# Patient Record
Sex: Male | Born: 1990 | Race: White | Hispanic: No | Marital: Single | Smoking: Never smoker
Health system: Southern US, Community
[De-identification: ages and names within clinical notes are randomized; demographics above are authoritative.]

---

## 2016-10-25 ENCOUNTER — Ambulatory Visit (INDEPENDENT_AMBULATORY_CARE_PROVIDER_SITE_OTHER): Payer: 59 | Admitting: Family Medicine

## 2016-10-25 VITALS — BP 118/78 | HR 76 | Temp 98.3°F | Resp 17 | Ht 67.0 in | Wt 176.0 lb

## 2016-10-25 DIAGNOSIS — F410 Panic disorder [episodic paroxysmal anxiety] without agoraphobia: Secondary | ICD-10-CM | POA: Diagnosis not present

## 2016-10-25 DIAGNOSIS — K529 Noninfective gastroenteritis and colitis, unspecified: Secondary | ICD-10-CM

## 2016-10-25 DIAGNOSIS — T783XXA Angioneurotic edema, initial encounter: Secondary | ICD-10-CM | POA: Diagnosis not present

## 2016-10-25 LAB — POCT URINALYSIS DIP (MANUAL ENTRY)
BILIRUBIN UA: NEGATIVE
GLUCOSE UA: NEGATIVE
Leukocytes, UA: NEGATIVE
Nitrite, UA: NEGATIVE
RBC UA: NEGATIVE
SPEC GRAV UA: 1.015
UROBILINOGEN UA: 0.2
pH, UA: 6.5

## 2016-10-25 MED ORDER — ONDANSETRON 4 MG PO TBDP
4.0000 mg | ORAL_TABLET | Freq: Once | ORAL | Status: AC
  Administered 2016-10-25: 4 mg via ORAL

## 2016-10-25 MED ORDER — ONDANSETRON 4 MG PO TBDP: 4.0000 mg | ORAL_TABLET | Freq: Three times a day (TID) | ORAL | 0 refills | Status: DC | PRN

## 2016-10-25 NOTE — Progress Notes (Addendum)
By signing my name below, I, Harry Gonzalez, attest that this documentation has been prepared under the direction and in the presence of Harry SorensonEva Jahnya Trindade, MD.  Electronically Signed: Arvilla MarketMesha Gonzalez, Medical Scribe. 10/25/16. 1:46 PM.  Subjective:    Patient ID: Harry Gonzalez, male    DOB: 1991/11/06, 25 y.o.   MRN: [id number]  HPI Chief Complaint  Patient presents with  . Emesis    AFter eating undercooked fish monday night. Currently c/o nausea only. Pt needs work note.    HPI Comments: Harry Coveyatrick J Chappelle is a 25 y.o. male who presents to the [facility] complaining of emesis onset 2 days ago. Pt ate fish from [employer] around 9:34 pm. The next day he woke up a few time and had several emesis episodes with times the emesis came through his nose. Pt has only ate chicken noodle soup broth, dry cereal. He has a lack of appetite and some nausea today. Pt had a bowel movement the first time in 3 days. Denies fever, abdominal pain, diarrhea, melena, abnormal urine color, and difficulty urinating.  Anxiety/Depression: Pt mentions when he had severe depression when he was 25 y/o for 4 months he lost 15 lbs in 1 month, he couldn't go to the bathroom, and lateral severe facial swelling- during that time he wasn't on any medications. He has never been on medications for his depression and anxiety. He reports focal random swelling in his face. He also mentions random anxiety attacks that cause him to feel light-headed and dizzy occurring for the past 2 months at anytime- pt doesn't suspect any triggers. 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 He had a few more "bad episodes", but the last 2 months the episodes have been light. He notes intense left arm pain and there was an episode that was paired with chest tightness and 30 mins later he had cold sweats. He took ASA for relief, but found his face broken out with the random swelling. The past 3  weeks he's had the sharp arm pain located in the middle of his arm that radiate upwards with the only associated sx of occasional loss of sensation in his left pinky and ring finger. Since having his arm pain, he's had less anxiety attacks. He suspects it's either stress or anxiety that's triggering it since when he does relaxing task like playing with his family, or talking to his dad the arm pain goes away. Pt passed all his classes and he's studying abroad in [place] and mentions he's not stressed. He admitts to being scared of messing up his study abroad opportunity since he's wanted it for so long. He doesn't like to take drugs or anything that alters his mental state, so he doesn't drink or do drugs. He plans on having a complete physical next week to get blood work and a check up done. Denies palpitations, nausea and diaphoresis during the arm pain, trouble sleeping, tongue and lip swelling.  FHx: breast CA, skin CA, DM, and heart disease.  There are no active problems to display for this patient.  No past medical history on file. No past surgical history on file. Not on File Prior to Admission medications   Medication Sig Start Date End Date Taking? Authorizing Provider  aspirin 325 MG tablet Take 325 mg by mouth daily.   Yes Historical Provider, MD   Social History   Social History  . Marital status:  Single    Spouse name: N/A  . Number of children: N/A  . Years of education: N/A   Occupational History  . Not on file.   Social History Main Topics  . Smoking status: Never Smoker  . Smokeless tobacco: Not on file  . Alcohol use Not on file  . Drug use: Unknown  . Sexual activity: Not on file   Other Topics Concern  . Not on file   Social History Narrative  . No narrative on file   Review of Systems  Constitutional: Positive for diaphoresis. Negative for fever.  HENT: Positive for facial swelling.   Respiratory: Positive for chest tightness.   Cardiovascular: Positive for  chest pain. Negative for palpitations.  Gastrointestinal: Positive for constipation, nausea and vomiting. Negative for abdominal pain, blood in stool and diarrhea.  Genitourinary: Negative for difficulty urinating.  Musculoskeletal: Positive for myalgias.  Neurological: Positive for dizziness, syncope, light-headedness and numbness.  Psychiatric/Behavioral: Positive for dysphoric mood. Negative for sleep disturbance. The patient is nervous/anxious.    Objective:  Physical Exam  Constitutional: He appears well-developed and well-nourished. No distress.  HENT:  Head: Normocephalic and atraumatic.  Right Ear: Tympanic membrane normal.  Left Ear: Tympanic membrane normal.  Eyes: Conjunctivae are normal.  Neck: Neck supple.  Cardiovascular: Normal rate, regular rhythm and normal heart sounds.  Exam reveals no gallop and no friction rub.   No murmur heard. Pulmonary/Chest: Effort normal and breath sounds normal. No respiratory distress. He has no wheezes. He has no rales.  Abdominal: Soft. He exhibits no distension. There is no tenderness. There is no rebound and no guarding.  Neurological: He is alert.  Skin: Skin is warm and dry.  Psychiatric: He has a normal mood and affect. His behavior is normal.  Nursing note and vitals reviewed.  BP 118/78 (BP Location: Right Arm, Patient Position: Sitting, Cuff Size: Normal)   Pulse 76   Temp 98.3 F (36.8 C) (Oral)   Resp 17   Ht 5\' 7"  (1.702 m)   Wt 176 lb (79.8 kg)   SpO2 99%   BMI 27.57 kg/m    Results for orders placed or performed in visit on 10/25/16  POCT urinalysis dipstick  Result Value Ref Range   Color, UA yellow yellow   Clarity, UA clear clear   Glucose, UA negative negative   Bilirubin, UA negative negative   Ketones, POC UA trace (5) (A) negative   Spec Grav, UA 1.015    Blood, UA negative negative   pH, UA 6.5    Protein Ur, POC trace (A) negative   Urobilinogen, UA 0.2    Nitrite, UA Negative Negative   Leukocytes,  UA Negative Negative    Assessment & Plan:   1. Gastroenteritis, acute - suspect self-limited due to food. Supportive care.   2. Panic attack - Suspect many of his symptoms such as the left arm pain are being caused by anxiety. Rec establishing with student health psych   3. Angioedema, initial encounter - patient reports numerous prior episodes of isolated facial swelling of unknown etiology. possible that this is due to stress but recommend workup to rule out hereditary idiopathic angioedema. Refer to allergy. Tried Benadryl if recurs   Recommend routine screening labs with CBC CMP TSH as well as EKG but patient declines today. Will consider obtaining in the future  Orders Placed This Encounter  Procedures  . Ambulatory referral to Allergy    Referral Priority:   Routine  Referral Type:   Allergy Testing    Referral Reason:   Specialty Services Required    Requested Specialty:   Allergy    Number of Visits Requested:   1  . Care order/instruction:    AVS printed - let patient go!  Marland Kitchen. POCT urinalysis dipstick    Meds ordered this encounter  Medications  . aspirin 325 MG tablet    Sig: Take 325 mg by mouth daily.  . ondansetron (ZOFRAN-ODT) disintegrating tablet 4 mg  . ondansetron (ZOFRAN ODT) 4 MG disintegrating tablet    Sig: Take 1-2 tablets (4-8 mg total) by mouth every 8 (eight) hours as needed for nausea or vomiting.    Dispense:  20 tablet    Refill:  0    I personally performed the services described in this documentation, which was scribed in my presence. The recorded information has been reviewed and considered, and addended by me as needed.   Harry SorensonEva Maylon Sailors, M.D.  [facility]  [place] [address] [place], [place] [address] [phone] phone [phone]) [phone] fax  11/22/16 1:26 PM

## 2016-10-25 NOTE — Patient Instructions (Addendum)
I agree with you that it seems likely that anxiety is driving at least most if not all of your symptoms. Certainly in some rare cases it is possible for stress or anxiety to bring on the swelling in your face. However this is most likely to run in families when it happens. I recommend referral to allergist for further evaluation. I would definitely recommend further evaluation with orthostatic vital signs, EKG, blood count, complete metabolic panel, checking your complement levels, and thyroid test.  I think going in for evaluation by your student health psychologist is a fabulous idea. I would red recommend making this appointment ASAP. Certainly there are additional steps that we can be taken to find out the etiology of your left arm pain if it does not resolve when your panic attacks were treated.  Benadryl can be a useful medicine which can help even nausea, vomiting, anxiety, and sleep so if you have any symptoms persist at night, I would recommend trying first.    IF you received an x-ray today, you will receive an invoice from [facility]. Please contact [facility] at [phone] with questions or concerns regarding your invoice.   IF you received labwork today, you will receive an invoice from [facility]. Please contact [facility] at [phone] with questions or concerns regarding your invoice.   Our billing staff will not be able to assist you with questions regarding bills from these companies.  You will be contacted with the lab results as soon as they are available. The fastest way to get your results is to activate your My Chart account. Instructions are located on the last page of this paperwork. If you have not heard from us regarding the results in 2 weeks, please contact this office.      Food Poisoning Food poisoning is an illness that is caused by eating or drinking contaminated foods or drinks. In most cases, food poisoning is  mild and lasts 1-2 days. However, some cases can be serious, especially for people who have weak body defense (immune) systems, older people, children and infants, and pregnant women. What are the causes? Foods can become contaminated with viruses, bacteria, parasites, mold, or chemicals as a result of:  Poor personal hygiene, such as poor hand washing practices.  Storing food improperly, such as not refrigerating raw meat.  Using unclean surfaces for serving, preparing, and storing food.  Cooking or eating with unclean utensils. If contaminated food is eaten, viruses, bacteria, or parasites can harm the intestine. This often causes severe diarrhea. The most common causes of food poisoning include:  Viruses, such as:  Norovirus.  Rotavirus.  Bacteria, such as:  Salmonella.  Listeria.  E. coli (Escherichia coli).  Parasites, such as:  Giardia.  Toxoplasmosis. What are the signs or symptoms? Symptoms may take several hours to appear after you consume contaminated food or drink. Symptoms include:  Nausea.  Vomiting.  Cramping.  Diarrhea.  Fever and chills.  Muscle aches.  Dehydration. Dehydration can cause you to be tired and thirsty, have a dry mouth, and urinate less frequently. How is this diagnosed? Your health care provider can diagnose food poisoning with a medical history and physical exam. This will include asking you what you have recently eaten. You may also have tests, including:  Blood tests.  Stool tests. How is this treated? Treatment focuses on relieving your symptoms and making sure that you are hydrated. You may also be given medicines. In severe cases, hospitalization may be required  and you may need to receive fluids through an IV tube. Follow these instructions at home: Eating and drinking  Drink enough fluids to keep your urine clear or pale yellow. You may need to drink small amounts of clear liquids frequently.  Avoid milk, caffeine,  and alcohol.  Ask your health care provider for specific rehydration instructions.  Eat small, frequent meals rather than large meals. Medicines  Take over-the-counter and prescription medicines only as told by your health care provider. Ask your health care provider if you should continue to take any of your regular prescribed and over-the-counter medicines.  If you were prescribed an antibiotic medicine, take it as told by your health care provider. Do not stop taking the antibiotic even if you start to feel better. General instructions  Wash your hands thoroughly before you prepare food and after you go to the bathroom (use the toilet). Make sure people who live with you also wash their hands often.  Clean surfaces that you touch with a product that contains chlorine bleach.  Keep all follow-up visits as told by your health care provider. This is important. How is this prevented?  Wash your hands, food preparation surfaces, and utensils thoroughly before and after you handle raw foods.  Use separate food preparation surfaces and storage spaces for raw meat and for fruits and vegetables.  Keep refrigerated foods colder than 24F (5C).  Serve hot foods immediately or keep them heated above 124F (60C).  Store dry foods in cool, dry spaces away from excess heat or moisture. Throw out any foods that do not smell right or are in cans that are bulging.  Follow approved canning procedures.  Heat canned foods thoroughly before you taste them.  Drink bottled or sterile water when you travel. Get help right away if: Call 911 or go to the emergency room if:  You have difficulty breathing, swallowing, talking, or moving.  You develop blurred vision.  You cannot eat or drink without vomiting.  You faint.  Your eyes turn yellow.  Your vomiting or diarrhea is persistent.  Abdominal pain develops, increases, or localizes in one small area.  You have a fever.  You have blood  or mucus in your stools, or your stools look dark black and tarry.  You have signs of dehydration, such as:  Dark urine, very little urine, or no urine.  Cracked lips.  Not making tears while crying.  Dry mouth.  Sunken eyes.  Sleepiness.  Weakness.  Dizziness. This information is not intended to replace advice given to you by your health care provider. Make sure you discuss any questions you have with your health care provider. Document Released: 07/28/2004 Document Revised: 03/28/2016 Document Reviewed: 05/03/2015 Elsevier Interactive Patient Education  2017 Elsevier Inc.  Angioedema Angioedema is the sudden swelling of tissue in the body. Angioedema can affect any part of the body, but it most often affects the deeper parts of the skin, causing red, itchy patches (hives) to appear over the affected area. It often begins during the night and is found in the morning. Depending on the cause, angioedema may happen:  Only once.  Several times. It may come back in unpredictable patterns.  Repeatedly for several years. Over time, it may gradually stop coming back. Angioedema can be life-threatening if it affects the air passages that you breathe through. What are the causes? This condition may be caused by:  Foods, such as milk, eggs, shellfish, wheat, or nuts.  Certain medicines, such as ACE  inhibitors, antibiotics, nonsteroidal anti-inflammatory drugs, birth control pills, or dyes used in X-rays.  Insect stings.  Infections. Angioedema can be inherited, and episodes can be triggered by:  Mild injury.  Dental work.  Surgery.  Stress.  Sudden changes in temperature.  Exercise. In some cases, the cause of this condition is not known. What are the signs or symptoms? Symptoms of this condition depend on where the swelling happens. Symptoms may include:  Swollen skin.  Red, itchy patches of skin (hives).  Redness in the affected area.  Pain in the affected  area.  Swollen lips or tongue.  Wheezing.  Breathing problems. If your internal organs are involved, symptoms may also include:  Nausea.  Abdominal pain.  Vomiting.  Difficulty swallowing.  Difficulty passing urine. How is this diagnosed? This condition may be diagnosed based on:  An exam of the affected area.  Your medical history.  Whether anyone in your family has had this condition before.  A review of any medicines you have been taking.  Tests, including:  Allergy skin tests to see if the condition was caused by an allergic reaction.  Blood tests to see if the condition was caused by a gene.  Tests to check for underlying diseases that could cause the condition. How is this treated? Treatment for this condition depends on the cause. It may involve any of the following:  If something triggered the condition, making changes to keep it from triggering the condition again.  If the condition affects your breathing, having tubes placed in your airway to keep it open.  Taking medicines to treat symptoms or prevent future episodes. These may include:  Antihistamines.  Epinephrine injections.  Steroids. If your condition is severe, you may need to be treated at the hospital. Angioedema usually gets better in 24-48 hours. Follow these instructions at home:  Take over-the-counter and prescription medicines only as told by your health care provider.  If you were given medicines for emergency allergy treatment, always carry them with you.  Wear a medical bracelet as told by your health care provider.  If something triggers your condition, avoid the trigger, if possible.  If your condition is inherited and you are thinking about having children, talk to your health care provider. It is important to discuss the risks of passing on the condition to your children. Contact a health care provider if:  You have repeated episodes of angioedema.  Episodes of  angioedema start to happen more often than they used to, even after you take steps to prevent them.  You have episodes of angioedema that are more severe than they have been before, even after you take steps to prevent them.  You are thinking about having children. Get help right away if:  You have severe swelling of your mouth, tongue, or lips.  You have trouble breathing.  You have trouble swallowing.  You faint. This information is not intended to replace advice given to you by your health care provider. Make sure you discuss any questions you have with your health care provider. Document Released: 01/08/2002 Document Revised: 05/27/2016 Document Reviewed: 05/09/2016 Elsevier Interactive Patient Education  2017 [employer]

## 2017-07-19 ENCOUNTER — Ambulatory Visit (HOSPITAL_COMMUNITY)
Admission: EM | Admit: 2017-07-19 | Discharge: 2017-07-19 | Disposition: A | Discharge status: Home / Self Care | Payer: 59 | Attending: Internal Medicine | Admitting: Internal Medicine

## 2017-07-19 ENCOUNTER — Encounter (HOSPITAL_COMMUNITY): Payer: Self-pay | Admitting: Family Medicine

## 2017-07-19 DIAGNOSIS — H65199 Other acute nonsuppurative otitis media, unspecified ear: Secondary | ICD-10-CM

## 2017-07-19 DIAGNOSIS — H8303 Labyrinthitis, bilateral: Secondary | ICD-10-CM

## 2017-07-19 MED ORDER — MECLIZINE HCL 12.5 MG PO TABS: 12.5000 mg | ORAL_TABLET | Freq: Three times a day (TID) | ORAL | 1 refills | Status: DC | PRN

## 2017-07-19 NOTE — ED Provider Notes (Signed)
  [facility]   [id number] 07/19/17 Arrival Time: 1003   SUBJECTIVE:  Harry Gonzalez is a 26 y.o. male who presents to the urgent care with complaint of bilateral ear pain and pressure, ringing in the ears, and ear pain. He believes he may have injured his ear, or cost and impaction, because he used a commercial device he purchased to try to remove the earwax. Denies any cough, congestion, has not had any exposure to water or swimming, no nausea or vomiting, has had some dizziness that is worse with movement. Otherwise denies any other symptoms.     History reviewed. No pertinent past medical history. History reviewed. No pertinent family history. Social History   Social History  . Marital status: Single    Spouse name: N/A  . Number of children: N/A  . Years of education: N/A   Occupational History  . Not on file.   Social History Main Topics  . Smoking status: Never Smoker  . Smokeless tobacco: Not on file  . Alcohol use Not on file  . Drug use: Unknown  . Sexual activity: Not on file   Other Topics Concern  . Not on file   Social History Narrative  . No narrative on file   No outpatient prescriptions have been marked as taking for the 07/19/17 encounter [facility](Hospital Encounter).   No Known Allergies    ROS: As per HPI, remainder of ROS negative.   OBJECTIVE:  Vitals:   07/19/17 1027  BP: 115/81  Pulse: 83  Resp: 18  Temp: 98.1 F (36.7 C)  SpO2: 99%     Vitals:   07/19/17 1027  BP: 115/81  Pulse: 83  Resp: 18  Temp: 98.1 F (36.7 C)  SpO2: 99%    General appearance: alert; no distress Eyes: PERRLA; EOMI; conjunctiva normal HENT: normocephalic; atraumatic; TMs normal, canal normal, external ears normal without trauma; nasal mucosa normal; Septum midline, nares are patent, no swelling, erythema, or edema to the turbinates ;oral mucosa normal Neck: supple, no cervical lymphadenopathy Lungs: clear to auscultation bilaterally Heart: regular  rate and rhythm Abdomen: soft, non-tender; bowel sounds normal; no masses or organomegaly; no guarding or rebound tenderness Back: no CVA tenderness Extremities: no cyanosis or edema; symmetrical with no gross deformities Skin: warm and dry Neurologic: normal gait; Psychological: alert and cooperative; normal mood and affect      Labs: Labs Reviewed - No data to display  No results found.     ASSESSMENT & PLAN:  1. Acute otitis media with effusion   2. Labyrinthitis of both ears     Meds ordered this encounter  Medications  . meclizine (ANTIVERT) 12.5 MG tablet    Sig: Take 1 tablet (12.5 mg total) by mouth 3 (three) times daily as needed for dizziness.    Dispense:  30 tablet    Refill:  1    Order Specific Question:   Supervising Provider    Answer:   Eustace MooreMURRAY, LAURA W [[id number]]   OTC Flonase along with antihistamines Reviewed expectations re: course of current medical issues. Questions answered. Outlined signs and symptoms indicating need for more acute intervention. Patient verbalized understanding. After Visit Summary given.    Procedures:        [name], [name], NP 07/19/17 1100

## 2017-07-19 NOTE — ED Triage Notes (Signed)
Pt here for ringing and pain in right ear. sts that he has had fever around 100. sts that all of this started after trying to clean ears.

## 2017-07-19 NOTE — Discharge Instructions (Signed)
You have otitis media with effusion. This is not an infection, but rather a buildup of fluid within the inner ear. This is treated with inhaled nasal steroid such as Flonase, or budesonide, 2 sprays each nostril once a day. You can also take allergy medicine such as Claritin, Allegra, Zyrtec, Xyzal daily with this medicine as well. If your symptoms persist or fail to resolve, return to clinic as needed.  Your dizziness is most likely caused by labyrinthitis, which is an acute inflammation of the inner ear. For your dizziness at prescribed a medicine called meclizine, 1 tablet up to 3 times a day as needed for dizziness. This may cause some drowsiness.

## 2019-07-01 ENCOUNTER — Other Ambulatory Visit: Payer: Self-pay | Admitting: Physician Assistant

## 2019-07-01 DIAGNOSIS — R1011 Right upper quadrant pain: Secondary | ICD-10-CM | POA: Diagnosis not present

## 2019-07-07 ENCOUNTER — Other Ambulatory Visit: Payer: 59

## 2019-07-17 ENCOUNTER — Ambulatory Visit
Admission: RE | Admit: 2019-07-17 | Discharge: 2019-07-17 | Disposition: A | Discharge status: Home / Self Care | Payer: BC Managed Care – PPO | Source: Ambulatory Visit | Attending: Physician Assistant | Admitting: Physician Assistant

## 2019-07-17 DIAGNOSIS — R1011 Right upper quadrant pain: Secondary | ICD-10-CM

## 2019-07-28 ENCOUNTER — Encounter: Payer: Self-pay | Admitting: *Deleted

## 2019-09-21 DIAGNOSIS — Z20828 Contact with and (suspected) exposure to other viral communicable diseases: Secondary | ICD-10-CM | POA: Diagnosis not present

## 2019-09-26 DIAGNOSIS — R079 Chest pain, unspecified: Secondary | ICD-10-CM | POA: Diagnosis not present

## 2019-09-29 ENCOUNTER — Telehealth: Payer: Self-pay | Admitting: *Deleted

## 2019-09-29 NOTE — Telephone Encounter (Signed)
Unable to reach no voicemail 

## 2019-10-22 NOTE — Progress Notes (Signed)
Cardiology Office Note:    Date:  10/23/2019   ID:  Harry Gonzalez, DOB July 26, 1991, MRN [id number]  PCP:  Patient, No Pcp Per  Cardiologist:  No primary care provider on file.  Electrophysiologist:  None   Referring MD: Blenda Mounts, MD   Chief Complaint  Patient presents with  . Chest Pain    History of Present Illness:    Harry Gonzalez is a 28 y.o. male with a hx of who is referred by Dr. Reola Calkins for evaluation of chest pain.  Reports that when he is sitting for an extended period of time he starts to develop pain that begins in his lower back.  States that pain will radiate up his back and also across his chest.  Occurs intermittently, will not have any episodes for months but then can happen multiple times per day.  Has been occurring for past year.  Describes that when pain radiates across his chest that feels pressure in his chest and back.  Starts at 2-3 out of 10 intensity but will escalate to 8-9 out of 10.  States that he walks for about an hour each day and prior to Covid was exercising in the gym.  Denies any exertional chest pain.  No smoking history.  No family history of heart disease in his immediate family.   No past medical history on file.  No past surgical history on file.  Current Medications: No outpatient medications have been marked as taking for the 10/23/19 encounter (Office Visit) with Little Ishikawa, MD.     Allergies:   Patient has no known allergies.   Social History   Socioeconomic History  . Marital status: Single    Spouse name: Not on file  . Number of children: Not on file  . Years of education: Not on file  . Highest education level: Not on file  Occupational History  . Not on file  Tobacco Use  . Smoking status: Never Smoker  Substance and Sexual Activity  . Alcohol use: Not on file  . Drug use: Not on file  . Sexual activity: Not on file  Other Topics Concern  . Not on file  Social History Narrative  . Not on  file   Social Determinants of Health   Financial Resource Strain:   . Difficulty of Paying Living Expenses: Not on file  Food Insecurity:   . Worried About Programme researcher, broadcasting/film/video in the Last Year: Not on file  . Ran Out of Food in the Last Year: Not on file  Transportation Needs:   . Lack of Transportation (Medical): Not on file  . Lack of Transportation (Non-Medical): Not on file  Physical Activity:   . Days of Exercise per Week: Not on file  . Minutes of Exercise per Session: Not on file  Stress:   . Feeling of Stress : Not on file  Social Connections:   . Frequency of Communication with Friends and Family: Not on file  . Frequency of Social Gatherings with Friends and Family: Not on file  . Attends Religious Services: Not on file  . Active Member of Clubs or Organizations: Not on file  . Attends Banker Meetings: Not on file  . Marital Status: Not on file     Family History: The patient's family history includes Diabetes in his father.  ROS:   Please see the history of present illness.    All other systems reviewed and are negative.  EKGs/Labs/Other Studies Reviewed:    The following studies were reviewed today:  EKG:  EKG is ordered today.  The ekg ordered today demonstrates normal sinus rhythm, rate 80, no ST/T abnormalities  Recent Labs: No results found for requested labs within last 8760 hours.  Recent Lipid Panel No results found for: CHOL, TRIG, HDL, CHOLHDL, VLDL, LDLCALC, LDLDIRECT  Physical Exam:    VS:  BP 119/79   Pulse 80   Temp (!) 97.5 F (36.4 C)   Ht 5\' 7"  (1.702 m)   Wt 213 lb (96.6 kg)   BMI 33.36 kg/m     Wt Readings from Last 3 Encounters:  10/23/19 213 lb (96.6 kg)  10/25/16 176 lb (79.8 kg)     GEN: Well nourished, well developed in no acute distress HEENT: Normal NECK: No JVD; No carotid bruits LYMPHATICS: No lymphadenopathy CARDIAC: RRR, no murmurs, rubs, gallops RESPIRATORY:  Clear to auscultation without rales,  wheezing or rhonchi  ABDOMEN: Soft, non-tender, non-distended MUSCULOSKELETAL:  No edema; No deformity  SKIN: Warm and dry NEUROLOGIC:  Alert and oriented x 3 PSYCHIATRIC:  Normal affect   ASSESSMENT:    1. Chest pain of uncertain etiology    PLAN:    In order of problems listed above:  Chest pain: Description consistent with noncardiac chest pain, as describes nonexertional pain that starts in his lower back and radiates across his chest.  Given age and lack of risk factors, he would be at low risk of coronary artery disease.  Recommend monitoring for now.     Medication Adjustments/Labs and Tests Ordered: Current medicines are reviewed at length with the patient today.  Concerns regarding medicines are outlined above.  No orders of the defined types were placed in this encounter.  No orders of the defined types were placed in this encounter.   Patient Instructions  Medication Instructions:  NO CHANGES  Lab Work: NONE   Testing/Procedures: NONE   Follow-Up: At [facility], you and your health needs are our priority.  As part of our continuing mission to provide you with exceptional heart care, we have created designated Provider Care Teams.  These Care Teams include your primary Cardiologist (physician) and Advanced Practice Providers (APPs -  Physician Assistants and Nurse Practitioners) who all work together to provide you with the care you need, when you need it.  Your next appointment:   3 month(s)  The format for your next appointment:   Either In Person or Virtual  Provider:   You may see DR [facility] or one of the following Advanced Practice Providers on your designated Care Team:    Rosaria Ferries, PA-C  Jory Sims, DNP, ANP  Cadence Kathlen Mody, NP       Signed, Donato Heinz, MD  10/23/2019 11:33 AM    [place]

## 2019-10-23 ENCOUNTER — Other Ambulatory Visit: Payer: Self-pay

## 2019-10-23 ENCOUNTER — Encounter: Payer: Self-pay | Admitting: Cardiology

## 2019-10-23 ENCOUNTER — Ambulatory Visit (INDEPENDENT_AMBULATORY_CARE_PROVIDER_SITE_OTHER): Payer: BC Managed Care – PPO | Admitting: Cardiology

## 2019-10-23 VITALS — BP 119/79 | HR 80 | Temp 97.5°F | Ht 67.0 in | Wt 213.0 lb

## 2019-10-23 DIAGNOSIS — R079 Chest pain, unspecified: Secondary | ICD-10-CM | POA: Diagnosis not present

## 2019-10-23 NOTE — Patient Instructions (Signed)
Medication Instructions:  NO CHANGES  Lab Work: NONE   Testing/Procedures: NONE   Follow-Up: At [facility], you and your health needs are our priority.  As part of our continuing mission to provide you with exceptional heart care, we have created designated Provider Care Teams.  These Care Teams include your primary Cardiologist (physician) and Advanced Practice Providers (APPs -  Physician Assistants and Nurse Practitioners) who all work together to provide you with the care you need, when you need it.  Your next appointment:   3 month(s)  The format for your next appointment:   Either In Person or Virtual  Provider:   You may see DR [facility] or one of the following Advanced Practice Providers on your designated Care Team:    Rosaria Ferries, PA-C  Jory Sims, DNP, ANP  Cadence Kathlen Mody, NP

## 2019-11-05 ENCOUNTER — Ambulatory Visit: Payer: BC Managed Care – PPO | Admitting: Neurology

## 2019-11-05 ENCOUNTER — Other Ambulatory Visit: Payer: Self-pay

## 2019-11-05 ENCOUNTER — Encounter: Payer: Self-pay | Admitting: Neurology

## 2019-11-05 DIAGNOSIS — R253 Fasciculation: Secondary | ICD-10-CM

## 2019-11-05 NOTE — Progress Notes (Signed)
Reason for visit: Muscle twitches, back pain, migraine headache  Referring physician: Dr. Luna Kitchens is a 28 y.o. male  History of present illness:  Mr. Hanak is a 28 year old right-handed white male with a history of muscle twitches that began approximately 2 months prior to this evaluation.  The patient will have migratory muscle twitches that occur in the thigh or arm or stomach or back.  He also has twitches that affect the upper eyelid off and on.  The patient may have 3-4 such episodes a week.  The episodes are not painful, he does not have muscle cramps.  He does not have any weakness of the extremities or numbness.  At times he may have some slight numbness in the thumbs and wrist pain, he works on a computer throughout the day.  The patient has very little exercise, he has gained 60 pounds in the last year to year and a half.  He reports some episodes of neuromuscular pain that began in the back and spread up the back or across from left to right or right to left while sitting.  If he gets up and stands and stretches this helps the pain.  He does have irritable bowel syndrome and he also has some problems with underlying anxiety issues.  He drinks 1 caffeinated suffering daily, he cannot drink coffee secondary to anxiety.  He has had a recent cardiology evaluation.  The patient does have episodes of increased heart rate at times.  The muscle twitches at times keep him awake at night.  The patient denies any balance problems or difficulty controlling the bladder.  He is sent to this office for an evaluation.  He reports history of migraine headaches occurring 2 or 3 times a week, sometimes associated with visual complaints.  History reviewed. No pertinent past medical history.  History reviewed. No pertinent surgical history.  Family History  Problem Relation Age of Onset  . Diabetes Father     Social history:  reports that he has never smoked. He has never used  smokeless tobacco. He reports previous alcohol use. No history on file for drug.  Medications:  Prior to Admission medications   Not on File     No Known Allergies  ROS:  Out of a complete 14 system review of symptoms, the patient complains only of the following symptoms, and all other reviewed systems are negative.  Muscle twitches Headache Irritable bowel  Blood pressure 121/84, pulse 80, temperature 97.7 F (36.5 C), height 5\' 6"  (1.676 m), weight 212 lb 8 oz (96.4 kg).  Physical Exam  General: The patient is alert and cooperative at the time of the examination.  The patient is moderately obese.  Eyes: Pupils are equal, round, and reactive to light. Discs are flat bilaterally.  Neck: The neck is supple, no carotid bruits are noted.  Respiratory: The respiratory examination is clear.  Cardiovascular: The cardiovascular examination reveals a regular rate and rhythm, no obvious murmurs or rubs are noted.  Skin: Extremities are without significant edema.  Neurologic Exam  Mental status: The patient is alert and oriented x 3 at the time of the examination. The patient has apparent normal recent and remote memory, with an apparently normal attention span and concentration ability.  Cranial nerves: Facial symmetry is present. There is good sensation of the face to pinprick and soft touch bilaterally. The strength of the facial muscles and the muscles to head turning and shoulder shrug are normal bilaterally.  Speech is well enunciated, no aphasia or dysarthria is noted. Extraocular movements are full. Visual fields are full. The tongue is midline, and the patient has symmetric elevation of the soft palate. No obvious hearing deficits are noted.  Motor: The motor testing reveals 5 over 5 strength of all 4 extremities. Good symmetric motor tone is noted throughout.  Sensory: Sensory testing is intact to pinprick, soft touch, vibration sensation, and position sense on all 4  extremities. No evidence of extinction is noted.  Coordination: Cerebellar testing reveals good finger-nose-finger and heel-to-shin bilaterally.  Tinel's sign at the wrists are negative bilaterally.  Gait and station: Gait is normal. Tandem gait is normal. Romberg is negative. No drift is seen.  Reflexes: Deep tendon reflexes are symmetric and normal bilaterally. Toes are downgoing bilaterally.   Assessment/Plan:  1.  Benign muscle twitches  2.  Migraine headache  3.  Intermittent neuromuscular discomfort, low back  I have recommended he get into a weight loss program and a regular exercise program.  This may help the back issues and reduce some of his underlying anxiety.  The muscle twitches are likely to be benign, he has no weakness on clinical examination and no evidence of atrophy.  No further neurologic work-up in this regard is indicated.  If the patient desires to have medication for his migraine, would consider starting propranolol which may also reduce anxiety.  At this point, he will follow-up through this office if needed.  Marlan Palau MD 11/05/2019 7:30 PM  [facility] [address] [place], [place] [address]  Phone [phone] Fax [phone]

## 2019-12-24 DIAGNOSIS — R519 Headache, unspecified: Secondary | ICD-10-CM | POA: Diagnosis not present

## 2019-12-24 DIAGNOSIS — R509 Fever, unspecified: Secondary | ICD-10-CM | POA: Diagnosis not present

## 2020-01-21 ENCOUNTER — Ambulatory Visit: Payer: BC Managed Care – PPO | Admitting: Cardiology

## 2020-02-25 NOTE — Progress Notes (Deleted)
Cardiology Office Note:    Date:  02/25/2020   ID:  Harry Gonzalez, DOB Oct 11, 1991, MRN [id number]  PCP:  Patient, No Pcp Per  Cardiologist:  No primary care provider on file.  Electrophysiologist:  None   Referring MD: No ref. provider found   No chief complaint on file.   History of Present Illness:    Harry Gonzalez is a 29 y.o. male with no significant past medical history who presents for follow-up. He was referred by Dr. Olevia Bowens for evaluation of chest pain, initially seen on 10/23/2019. Reports that when he is sitting for an extended period of time he starts to develop pain that begins in his lower back.  States that pain will radiate up his back and also across his chest.  Occurs intermittently, will not have any episodes for months but then can happen multiple times per day.  Has been occurring for past year.  Describes that when pain radiates across his chest that feels pressure in his chest and back.  Starts at 2-3 out of 10 intensity but will escalate to 8-9 out of 10.  States that he walks for about an hour each day and prior to Covid was exercising in the gym.  Denies any exertional chest pain.  No smoking history.  No family history of heart disease in his immediate family.  At initial clinic visit on 10/23/2019, felt to likely represent noncardiac chest pain. Recommended monitoring.  No past medical history on file.  No past surgical history on file.  Current Medications: No outpatient medications have been marked as taking for the 02/26/20 encounter (Appointment) with Donato Heinz, MD.     Allergies:   Patient has no known allergies.   Social History   Socioeconomic History  . Marital status: Single    Spouse name: Not on file  . Number of children: Not on file  . Years of education: Not on file  . Highest education level: Not on file  Occupational History  . Not on file  Tobacco Use  . Smoking status: Never Smoker  . Smokeless tobacco: Never Used   Substance and Sexual Activity  . Alcohol use: Not Currently  . Drug use: Not on file  . Sexual activity: Not on file  Other Topics Concern  . Not on file  Social History Narrative  . Not on file   Social Determinants of Health   Financial Resource Strain:   . Difficulty of Paying Living Expenses:   Food Insecurity:   . Worried About Charity fundraiser in the Last Year:   . Arboriculturist in the Last Year:   Transportation Needs:   . Film/video editor (Medical):   Marland Kitchen Lack of Transportation (Non-Medical):   Physical Activity:   . Days of Exercise per Week:   . Minutes of Exercise per Session:   Stress:   . Feeling of Stress :   Social Connections:   . Frequency of Communication with Friends and Family:   . Frequency of Social Gatherings with Friends and Family:   . Attends Religious Services:   . Active Member of Clubs or Organizations:   . Attends Archivist Meetings:   Marland Kitchen Marital Status:      Family History: The patient's family history includes Diabetes in his father.  ROS:   Please see the history of present illness.    All other systems reviewed and are negative.  EKGs/Labs/Other Studies Reviewed:  The following studies were reviewed today:  EKG:  EKG is ordered today.  The ekg ordered today demonstrates normal sinus rhythm, rate 80, no ST/T abnormalities  Recent Labs: No results found for requested labs within last 8760 hours.  Recent Lipid Panel No results found for: CHOL, TRIG, HDL, CHOLHDL, VLDL, LDLCALC, LDLDIRECT  Physical Exam:    VS:  There were no vitals taken for this visit.    Wt Readings from Last 3 Encounters:  11/05/19 212 lb 8 oz (96.4 kg)  10/23/19 213 lb (96.6 kg)  10/25/16 176 lb (79.8 kg)     GEN: Well nourished, well developed in no acute distress HEENT: Normal NECK: No JVD; No carotid bruits LYMPHATICS: No lymphadenopathy CARDIAC: RRR, no murmurs, rubs, gallops RESPIRATORY:  Clear to auscultation without  rales, wheezing or rhonchi  ABDOMEN: Soft, non-tender, non-distended MUSCULOSKELETAL:  No edema; No deformity  SKIN: Warm and dry NEUROLOGIC:  Alert and oriented x 3 PSYCHIATRIC:  Normal affect   ASSESSMENT:    No diagnosis found. PLAN:    In order of problems listed above:  Chest pain: Description consistent with noncardiac chest pain, as describes nonexertional pain that starts in his lower back and radiates across his chest.  Given age and lack of risk factors, he would be at low risk of coronary artery disease.  Recommend monitoring for now.     Medication Adjustments/Labs and Tests Ordered: Current medicines are reviewed at length with the patient today.  Concerns regarding medicines are outlined above.  No orders of the defined types were placed in this encounter.  No orders of the defined types were placed in this encounter.   There are no Patient Instructions on file for this visit.   Signed, Little Ishikawa, MD  02/25/2020 9:06 PM    [facility]

## 2020-02-26 ENCOUNTER — Ambulatory Visit: Payer: BC Managed Care – PPO | Admitting: Cardiology

## 2020-03-21 NOTE — Progress Notes (Signed)
Cardiology Office Note:    Date:  03/24/2020   ID:  Harry Gonzalez, DOB 05/19/1991, MRN [id number]  PCP:  Patient, No Pcp Per  Cardiologist:  No primary care provider on file.  Electrophysiologist:  None   Referring MD: No ref. provider found   Chief Complaint  Patient presents with  . Chest Pain    History of Present Illness:    Harry Gonzalez is a 29 y.o. male with no significant past medical history who presents for follow-up. He was referred by Dr. Reola Calkins for evaluation of chest pain, initially seen on 10/23/2019. Reports that when he is sitting for an extended period of time he starts to develop pain that begins in his lower back.  States that pain will radiate up his back and also across his chest.  Occurs intermittently, will not have any episodes for months but then can happen multiple times per day.  Has been occurring for past year.  Describes that when pain radiates across his chest that feels pressure in his chest and back.  Starts at 2-3 out of 10 intensity but will escalate to 8-9 out of 10.  States that he walks for about an hour each day and prior to Covid was exercising in the gym.  Denies any exertional chest pain.  No smoking history.  No family history of heart disease in his immediate family.  Significant history in extended family, as reports his paternal grandfather died of a heart attack in his 67s and great uncles on his father's side also had heart attacks in 73s (though reports issues with drug abuse).  At initial clinic visit on 10/23/2019, felt to likely represent noncardiac chest pain. Recommended monitoring.  Since his initial visit, he reports that he has continued to have intermittent chest pain.  Occurs about once every 1 to 2 weeks.  He walks about 4 miles per day, denies any exertional pain.  Also reports that he has been having palpitations, that he describes as feeling like his heart is racing.  Has only occurred at night.  Happens about once every 2 weeks.   He denies any lightheadedness or syncope.    No past medical history on file.  No past surgical history on file.  Current Medications: No outpatient medications have been marked as taking for the 03/24/20 encounter (Office Visit) with Little Ishikawa, MD.     Allergies:   Patient has no known allergies.   Social History   Socioeconomic History  . Marital status: Single    Spouse name: Not on file  . Number of children: Not on file  . Years of education: Not on file  . Highest education level: Not on file  Occupational History  . Not on file  Tobacco Use  . Smoking status: Never Smoker  . Smokeless tobacco: Never Used  Substance and Sexual Activity  . Alcohol use: Not Currently  . Drug use: Not on file  . Sexual activity: Not on file  Other Topics Concern  . Not on file  Social History Narrative  . Not on file   Social Determinants of Health   Financial Resource Strain:   . Difficulty of Paying Living Expenses:   Food Insecurity:   . Worried About Programme researcher, broadcasting/film/video in the Last Year:   . Barista in the Last Year:   Transportation Needs:   . Freight forwarder (Medical):   Marland Kitchen Lack of Transportation (Non-Medical):   Physical Activity:   .  Days of Exercise per Week:   . Minutes of Exercise per Session:   Stress:   . Feeling of Stress :   Social Connections:   . Frequency of Communication with Friends and Family:   . Frequency of Social Gatherings with Friends and Family:   . Attends Religious Services:   . Active Member of Clubs or Organizations:   . Attends Archivist Meetings:   Marland Kitchen Marital Status:      Family History: The patient's family history includes Diabetes in his father.  ROS:   Please see the history of present illness.    All other systems reviewed and are negative.  EKGs/Labs/Other Studies Reviewed:    The following studies were reviewed today:  EKG:  EKG is ordered today.  The ekg ordered today demonstrates  normal sinus rhythm, rate 80, no ST/T abnormalities  Recent Labs: No results found for requested labs within last 8760 hours.  Recent Lipid Panel No results found for: CHOL, TRIG, HDL, CHOLHDL, VLDL, LDLCALC, LDLDIRECT  Physical Exam:    VS:  BP 120/87 (BP Location: Left Arm, Patient Position: Sitting, Cuff Size: Large)   Pulse 87   Ht 5\' 7"  (1.702 m)   Wt 218 lb (98.9 kg)   SpO2 99%   BMI 34.14 kg/m     Wt Readings from Last 3 Encounters:  03/24/20 218 lb (98.9 kg)  11/05/19 212 lb 8 oz (96.4 kg)  10/23/19 213 lb (96.6 kg)     GEN: Well nourished, well developed in no acute distress HEENT: Normal NECK: No JVD; No carotid bruits LYMPHATICS: No lymphadenopathy CARDIAC: RRR, no murmurs, rubs, gallops RESPIRATORY:  Clear to auscultation without rales, wheezing or rhonchi  ABDOMEN: Soft, non-tender, non-distended MUSCULOSKELETAL:  No edema; No deformity  SKIN: Warm and dry NEUROLOGIC:  Alert and oriented x 3 PSYCHIATRIC:  Normal affect   ASSESSMENT:    1. Palpitations   2. Chest pain of uncertain etiology    PLAN:     Palpitations: Description concerning for arrhythmia, will check Zio patch x2 weeks  Chest pain: Description consistent with noncardiac chest pain, as describes nonexertional pain that starts in his lower back and radiates across his chest.  Given age and lack of risk factors, he would be at low risk of coronary artery disease.  Recommend monitoring for now.    RTC in 3 months   Medication Adjustments/Labs and Tests Ordered: Current medicines are reviewed at length with the patient today.  Concerns regarding medicines are outlined above.  Orders Placed This Encounter  Procedures  . LONG TERM MONITOR (3-14 DAYS)   No orders of the defined types were placed in this encounter.   Patient Instructions  Medication Instructions:  Your physician recommends that you continue on your current medications as directed. Please refer to the Current Medication  list given to you today.  Testing/Procedures:  Bryn Gulling- Long Term Monitor Instructions   Your physician has requested you wear your ZIO patch monitor 14 days.   This is a single patch monitor.  [employer] supplies one patch monitor per enrollment.  Additional stickers are not available.   Please do not apply patch if you will be having a Nuclear Stress Test, Echocardiogram, Cardiac CT, MRI, or Chest Xray during the time frame you would be wearing the monitor. The patch cannot be worn during these tests.  You cannot remove and re-apply the ZIO XT patch monitor.   Your ZIO patch monitor will be sent USPS Priority  mail from [employer] directly to your home address. The monitor may also be mailed to a PO BOX if home delivery is not available.   It may take 3-5 days to receive your monitor after you have been enrolled.   Once you have received you monitor, please review enclosed instructions.  Your monitor has already been registered assigning a specific monitor serial # to you.   Applying the monitor   Shave hair from upper left chest.   Hold abrader disc by orange tab.  Rub abrader in 40 strokes over left upper chest as indicated in your monitor instructions.   Clean area with 4 enclosed alcohol pads .  Use all pads to assure are is cleaned thoroughly.  Let dry.   Apply patch as indicated in monitor instructions.  Patch will be place under collarbone on left side of chest with arrow pointing upward.   Rub patch adhesive wings for 2 minutes.Remove white label marked "1".  Remove white label marked "2".  Rub patch adhesive wings for 2 additional minutes.   While looking in a mirror, press and release button in center of patch.  A small green light will flash 3-4 times .  This will be your only indicator the monitor has been turned on.     Do not shower for the first 24 hours.  You may shower after the first 24 hours.   Press button if you feel a symptom. You will hear a small click.   Record Date, Time and Symptom in the Patient Log Book.   When you are ready to remove patch, follow instructions on last 2 pages of Patient Log Book.  Stick patch monitor onto last page of Patient Log Book.   Place Patient Log Book in [place] box.  Use locking tab on box and tape box closed securely.  The [employer] and [employer] has [employer] on it.  Please place in mailbox as soon as possible.  Your physician should have your test results approximately 7 days after the monitor has been mailed back to [facility].   Call [facility] Customer Care at [phone] if you have questions regarding your ZIO XT patch monitor.  Call them immediately if you see an orange light blinking on your monitor.   If your monitor falls off in less than 4 days contact our Monitor department at [phone].  If your monitor becomes loose or falls off after 4 days call [employer] at [phone] for suggestions on securing your monitor.     Follow-Up: At [facility], you and your health needs are our priority.  As part of our continuing mission to provide you with exceptional heart care, we have created designated Provider Care Teams.  These Care Teams include your primary Cardiologist (physician) and Advanced Practice Providers (APPs -  Physician Assistants and Nurse Practitioners) who all work together to provide you with the care you need, when you need it.  We recommend signing up for the patient portal called "MyChart".  Sign up information is provided on this After Visit Summary.  MyChart is used to connect with patients for Virtual Visits (Telemedicine).  Patients are able to view lab/test results, encounter notes, upcoming appointments, etc.  Non-urgent messages can be sent to your provider as well.   To learn more about what you can do with MyChart, go to [email or web address].    Your next appointment:   3 month(s)  The format for your next appointment:   In Person or Virtual  Provider:    Epifanio Lesches, MD       Signed, Little Ishikawa, MD  03/24/2020 6:38 PM    [facility]

## 2020-03-24 ENCOUNTER — Other Ambulatory Visit: Payer: Self-pay

## 2020-03-24 ENCOUNTER — Encounter: Payer: Self-pay | Admitting: *Deleted

## 2020-03-24 ENCOUNTER — Ambulatory Visit (INDEPENDENT_AMBULATORY_CARE_PROVIDER_SITE_OTHER): Payer: BC Managed Care – PPO | Admitting: Cardiology

## 2020-03-24 ENCOUNTER — Encounter: Payer: Self-pay | Admitting: Cardiology

## 2020-03-24 VITALS — BP 120/87 | HR 87 | Ht 67.0 in | Wt 218.0 lb

## 2020-03-24 DIAGNOSIS — R002 Palpitations: Secondary | ICD-10-CM | POA: Diagnosis not present

## 2020-03-24 DIAGNOSIS — R079 Chest pain, unspecified: Secondary | ICD-10-CM

## 2020-03-24 NOTE — Progress Notes (Signed)
Patient ID: Harry Gonzalez, male   DOB: December 08, 1990, 29 y.o.   MRN: [id number] ZIO XT sent to patients home.

## 2020-03-24 NOTE — Patient Instructions (Signed)
Medication Instructions:  Your physician recommends that you continue on your current medications as directed. Please refer to the Current Medication list given to you today.  Testing/Procedures:  Christena Deem- Long Term Monitor Instructions   Your physician has requested you wear your ZIO patch monitor 14 days.   This is a single patch monitor.  [employer] supplies one patch monitor per enrollment.  Additional stickers are not available.   Please do not apply patch if you will be having a Nuclear Stress Test, Echocardiogram, Cardiac CT, MRI, or Chest Xray during the time frame you would be wearing the monitor. The patch cannot be worn during these tests.  You cannot remove and re-apply the ZIO XT patch monitor.   Your ZIO patch monitor will be sent USPS Priority mail from [facility] directly to your home address. The monitor may also be mailed to a PO BOX if home delivery is not available.   It may take 3-5 days to receive your monitor after you have been enrolled.   Once you have received you monitor, please review enclosed instructions.  Your monitor has already been registered assigning a specific monitor serial # to you.   Applying the monitor   Shave hair from upper left chest.   Hold abrader disc by orange tab.  Rub abrader in 40 strokes over left upper chest as indicated in your monitor instructions.   Clean area with 4 enclosed alcohol pads .  Use all pads to assure are is cleaned thoroughly.  Let dry.   Apply patch as indicated in monitor instructions.  Patch will be place under collarbone on left side of chest with arrow pointing upward.   Rub patch adhesive wings for 2 minutes.Remove white label marked "1".  Remove white label marked "2".  Rub patch adhesive wings for 2 additional minutes.   While looking in a mirror, press and release button in center of patch.  A small green light will flash 3-4 times .  This will be your only indicator the monitor has been turned on.      Do not shower for the first 24 hours.  You may shower after the first 24 hours.   Press button if you feel a symptom. You will hear a small click.  Record Date, Time and Symptom in the Patient Log Book.   When you are ready to remove patch, follow instructions on last 2 pages of Patient Log Book.  Stick patch monitor onto last page of Patient Log Book.   Place Patient Log Book in [place] box.  Use locking tab on box and tape box closed securely.  The [employer] has [employer] on it.  Please place in mailbox as soon as possible.  Your physician should have your test results approximately 7 days after the monitor has been mailed back to [facility].   Call [facility] Customer Care at [phone] if you have questions regarding your ZIO XT patch monitor.  Call them immediately if you see an orange light blinking on your monitor.   If your monitor falls off in less than 4 days contact our Monitor department at [phone].  If your monitor becomes loose or falls off after 4 days call [employer] at [phone] for suggestions on securing your monitor.     Follow-Up: At [facility], you and your health needs are our priority.  As part of our continuing mission to provide you with exceptional heart care, we have created designated Provider Care  Teams.  These Care Teams include your primary Cardiologist (physician) and Advanced Practice Providers (APPs -  Physician Assistants and Nurse Practitioners) who all work together to provide you with the care you need, when you need it.  We recommend signing up for the patient portal called "MyChart".  Sign up information is provided on this After Visit Summary.  MyChart is used to connect with patients for Virtual Visits (Telemedicine).  Patients are able to view lab/test results, encounter notes, upcoming appointments, etc.  Non-urgent messages can be sent to your provider as well.   To learn more about what you can do with MyChart, go  to [email or web address].    Your next appointment:   3 month(s)  The format for your next appointment:   In Person or Virtual  Provider:   Epifanio Lesches, MD

## 2020-03-29 ENCOUNTER — Ambulatory Visit (INDEPENDENT_AMBULATORY_CARE_PROVIDER_SITE_OTHER): Payer: BC Managed Care – PPO

## 2020-03-29 DIAGNOSIS — R002 Palpitations: Secondary | ICD-10-CM

## 2020-05-10 DIAGNOSIS — J029 Acute pharyngitis, unspecified: Secondary | ICD-10-CM | POA: Diagnosis not present

## 2020-05-10 DIAGNOSIS — K219 Gastro-esophageal reflux disease without esophagitis: Secondary | ICD-10-CM | POA: Diagnosis not present

## 2020-06-27 NOTE — Progress Notes (Deleted)
Cardiology Office Note:    Date:  06/27/2020   ID:  Harry Gonzalez, DOB 07-12-1991, MRN [id number]  PCP:  Patient, No Pcp Per  Cardiologist:  No primary care provider on file.  Electrophysiologist:  None   Referring MD: No ref. provider found   No chief complaint on file.   History of Present Illness:    Harry Gonzalez is a 29 y.o. male with no significant past medical history who presents for follow-up. He was referred by Dr. Reola Calkins for evaluation of chest pain, initially seen on 10/23/2019. Reports that when he is sitting for an extended period of time he starts to develop pain that begins in his lower back.  States that pain will radiate up his back and also across his chest.  Occurs intermittently, will not have any episodes for months but then can happen multiple times per day.  Has been occurring for past year.  Describes that when pain radiates across his chest that feels pressure in his chest and back.  Starts at 2-3 out of 10 intensity but will escalate to 8-9 out of 10.  States that he walks for about an hour each day and prior to Covid was exercising in the gym.  Denies any exertional chest pain.  No smoking history.  No family history of heart disease in his immediate family.  Significant history in extended family, as reports his paternal grandfather died of a heart attack in his 72s and great uncles on his father's side also had heart attacks in 51s (though reports issues with drug abuse).  At initial clinic visit on 10/23/2019, felt to likely represent noncardiac chest pain. Recommended monitoring.  Since his initial visit, he reports that he has continued to have intermittent chest pain.  Occurs about once every 1 to 2 weeks.  He walks about 4 miles per day, denies any exertional pain.  Also reports that he has been having palpitations, that he describes as feeling like his heart is racing.  Has only occurred at night.  Happens about once every 2 weeks.  He denies any  lightheadedness or syncope.  Zio patch x13 days on 06/16/2020 showed no significant arrhythmias.    No past medical history on file.  No past surgical history on file.  Current Medications: No outpatient medications have been marked as taking for the 07/02/20 encounter (Appointment) with Little Ishikawa, MD.     Allergies:   Patient has no known allergies.   Social History   Socioeconomic History  . Marital status: Single    Spouse name: Not on file  . Number of children: Not on file  . Years of education: Not on file  . Highest education level: Not on file  Occupational History  . Not on file  Tobacco Use  . Smoking status: Never Smoker  . Smokeless tobacco: Never Used  Substance and Sexual Activity  . Alcohol use: Not Currently  . Drug use: Not on file  . Sexual activity: Not on file  Other Topics Concern  . Not on file  Social History Narrative  . Not on file   Social Determinants of Health   Financial Resource Strain:   . Difficulty of Paying Living Expenses:   Food Insecurity:   . Worried About Programme researcher, broadcasting/film/video in the Last Year:   . Barista in the Last Year:   Transportation Needs:   . Freight forwarder (Medical):   Marland Kitchen Lack of Transportation (Non-Medical):  Physical Activity:   . Days of Exercise per Week:   . Minutes of Exercise per Session:   Stress:   . Feeling of Stress :   Social Connections:   . Frequency of Communication with Friends and Family:   . Frequency of Social Gatherings with Friends and Family:   . Attends Religious Services:   . Active Member of Clubs or Organizations:   . Attends Banker Meetings:   Marland Kitchen Marital Status:      Family History: The patient's family history includes Diabetes in his father.  ROS:   Please see the history of present illness.    All other systems reviewed and are negative.  EKGs/Labs/Other Studies Reviewed:    The following studies were reviewed today:  EKG:  EKG  is ordered today.  The ekg ordered today demonstrates normal sinus rhythm, rate 80, no ST/T abnormalities  Recent Labs: No results found for requested labs within last 8760 hours.  Recent Lipid Panel No results found for: CHOL, TRIG, HDL, CHOLHDL, VLDL, LDLCALC, LDLDIRECT  Physical Exam:    VS:  There were no vitals taken for this visit.    Wt Readings from Last 3 Encounters:  03/24/20 218 lb (98.9 kg)  11/05/19 212 lb 8 oz (96.4 kg)  10/23/19 213 lb (96.6 kg)     GEN: Well nourished, well developed in no acute distress HEENT: Normal NECK: No JVD; No carotid bruits LYMPHATICS: No lymphadenopathy CARDIAC: RRR, no murmurs, rubs, gallops RESPIRATORY:  Clear to auscultation without rales, wheezing or rhonchi  ABDOMEN: Soft, non-tender, non-distended MUSCULOSKELETAL:  No edema; No deformity  SKIN: Warm and dry NEUROLOGIC:  Alert and oriented x 3 PSYCHIATRIC:  Normal affect   ASSESSMENT:    No diagnosis found. PLAN:     Palpitations: Description concerning for arrhythmia.  Zio patch x13 days on 06/16/2020 showed no significant arrhythmias.  Chest pain: Description consistent with noncardiac chest pain, as describes nonexertional pain that starts in his lower back and radiates across his chest.  Given age and lack of risk factors, he would be at low risk of coronary artery disease.  Recommend monitoring for now.    RTC in ***   Medication Adjustments/Labs and Tests Ordered: Current medicines are reviewed at length with the patient today.  Concerns regarding medicines are outlined above.  No orders of the defined types were placed in this encounter.  No orders of the defined types were placed in this encounter.   There are no Patient Instructions on file for this visit.   Signed, Little Ishikawa, MD  06/27/2020 2:08 PM    [facility]

## 2020-07-02 ENCOUNTER — Encounter: Payer: Self-pay | Admitting: *Deleted

## 2020-07-02 ENCOUNTER — Ambulatory Visit: Payer: BC Managed Care – PPO | Admitting: Cardiology

## 2020-08-16 NOTE — Progress Notes (Deleted)
Cardiology Office Note:    Date:  08/16/2020   ID:  Harry Gonzalez, DOB August 19, 1991, MRN [id number]  PCP:  Patient, No Pcp Per  Cardiologist:  No primary care provider on file.  Electrophysiologist:  None   Referring MD: No ref. provider found   No chief complaint on file.   History of Present Illness:    Harry Gonzalez is a 29 y.o. male with no significant past medical history who presents for follow-up. He was referred by Dr. Reola Gonzalez for evaluation of chest pain, initially seen on 10/23/2019. Reports that when he is sitting for an extended period of time he starts to develop pain that begins in his lower back.  States that pain will radiate up his back and also across his chest.  Occurs intermittently, will not have any episodes for months but then can happen multiple times per day.  Has been occurring for past year.  Describes that when pain radiates across his chest that feels pressure in his chest and back.  Starts at 2-3 out of 10 intensity but will escalate to 8-9 out of 10.  States that he walks for about an hour each day and prior to Covid was exercising in the gym.  Denies any exertional chest pain.  No smoking history.  No family history of heart disease in his immediate family.  Significant history in extended family, as reports his paternal grandfather died of a heart attack in his 47s and great uncles on his father's side also had heart attacks in 75s (though reports issues with drug abuse).  At initial clinic visit on 10/23/2019, felt to likely represent noncardiac chest pain. Recommended monitoring.  Follow-up visit he reported that he has been having palpitations.  Zio patch x14 days was done on 06/16/2020, which showed no significant arrhythmias.    No past medical history on file.  No past surgical history on file.  Current Medications: No outpatient medications have been marked as taking for the 08/17/20 encounter (Appointment) with Harry Ishikawa, MD.      Allergies:   Patient has no known allergies.   Social History   Socioeconomic History   Marital status: Single    Spouse name: Not on file   Number of children: Not on file   Years of education: Not on file   Highest education level: Not on file  Occupational History   Not on file  Tobacco Use   Smoking status: Never Smoker   Smokeless tobacco: Never Used  Substance and Sexual Activity   Alcohol use: Not Currently   Drug use: Not on file   Sexual activity: Not on file  Other Topics Concern   Not on file  Social History Narrative   Not on file   Social Determinants of Health   Financial Resource Strain:    Difficulty of Paying Living Expenses: Not on file  Food Insecurity:    Worried About Running Out of Food in the Last Year: Not on file   Ran Out of Food in the Last Year: Not on file  Transportation Needs:    Lack of Transportation (Medical): Not on file   Lack of Transportation (Non-Medical): Not on file  Physical Activity:    Days of Exercise per Week: Not on file   Minutes of Exercise per Session: Not on file  Stress:    Feeling of Stress : Not on file  Social Connections:    Frequency of Communication with Friends and Family: Not on  file   Frequency of Social Gatherings with Friends and Family: Not on file   Attends Religious Services: Not on file   Active Member of Clubs or Organizations: Not on file   Attends Banker Meetings: Not on file   Marital Status: Not on file     Family History: The patient's family history includes Diabetes in his father.  ROS:   Please see the history of present illness.    All other systems reviewed and are negative.  EKGs/Labs/Other Studies Reviewed:    The following studies were reviewed today:  EKG:  EKG is ordered today.  The ekg ordered today demonstrates normal sinus rhythm, rate 80, no ST/T abnormalities  Recent Labs: No results found for requested labs within last 8760  hours.  Recent Lipid Panel No results found for: CHOL, TRIG, HDL, CHOLHDL, VLDL, LDLCALC, LDLDIRECT  Physical Exam:    VS:  There were no vitals taken for this visit.    Wt Readings from Last 3 Encounters:  03/24/20 218 lb (98.9 kg)  11/05/19 212 lb 8 oz (96.4 kg)  10/23/19 213 lb (96.6 kg)     GEN: Well nourished, well developed in no acute distress HEENT: Normal NECK: No JVD; No carotid bruits LYMPHATICS: No lymphadenopathy CARDIAC: RRR, no murmurs, rubs, gallops RESPIRATORY:  Clear to auscultation without rales, wheezing or rhonchi  ABDOMEN: Soft, non-tender, non-distended MUSCULOSKELETAL:  No edema; No deformity  SKIN: Warm and dry NEUROLOGIC:  Alert and oriented x 3 PSYCHIATRIC:  Normal affect   ASSESSMENT:    No diagnosis found. PLAN:     Palpitations: Zio patch x2 weeks showed no significant arrhythmias.  Chest pain: Description consistent with noncardiac chest pain, as describes nonexertional pain that starts in his lower back and radiates across his chest.  Given age and lack of risk factors, he would be at low risk of coronary artery disease.  Recommend monitoring for now.    RTC in 3 months   Medication Adjustments/Labs and Tests Ordered: Current medicines are reviewed at length with the patient today.  Concerns regarding medicines are outlined above.  No orders of the defined types were placed in this encounter.  No orders of the defined types were placed in this encounter.   There are no Patient Instructions on file for this visit.   Signed, Harry Ishikawa, MD  08/16/2020 11:35 PM    [facility]

## 2020-08-17 ENCOUNTER — Ambulatory Visit: Payer: Self-pay | Admitting: Cardiology

## 2021-01-09 NOTE — Progress Notes (Deleted)
Cardiology Office Note:    Date:  01/09/2021   ID:  Harry Gonzalez, DOB May 02, 1991, MRN [id number]  PCP:  Patient, No Pcp Per  Cardiologist:  No primary care provider on file.  Electrophysiologist:  None   Referring MD: No ref. provider found   No chief complaint on file.   History of Present Illness:    Harry Gonzalez is a 30 y.o. male with no significant past medical history who presents for follow-up. He was referred by Dr. Reola Calkins for evaluation of chest pain, initially seen on 10/23/2019. Reports that when he is sitting for an extended period of time he starts to develop pain that begins in his lower back.  States that pain will radiate up his back and also across his chest.  Occurs intermittently, will not have any episodes for months but then can happen multiple times per day.  Has been occurring for past year.  Describes that when pain radiates across his chest that feels pressure in his chest and back.  Starts at 2-3 out of 10 intensity but will escalate to 8-9 out of 10.  States that he walks for about an hour each day and prior to Covid was exercising in the gym.  Denies any exertional chest pain.  No smoking history.  No family history of heart disease in his immediate family.  Significant history in extended family, as reports his paternal grandfather died of a heart attack in his 26s and great uncles on his father's side also had heart attacks in 20s (though reports issues with drug abuse).  At initial clinic visit on 10/23/2019, felt to likely represent noncardiac chest pain. Recommended monitoring.  Since his initial visit, he reports that he has continued to have intermittent chest pain.  Occurs about once every 1 to 2 weeks.  He walks about 4 miles per day, denies any exertional pain.  Also reports that he has been having palpitations, that he describes as feeling like his heart is racing.  Has only occurred at night.  Happens about once every 2 weeks.  He denies any  lightheadedness or syncope.  Zio patch on 06/16/2020 showed no significant arrhythmias.    No past medical history on file.  No past surgical history on file.  Current Medications: No outpatient medications have been marked as taking for the 01/10/21 encounter (Appointment) with Little Ishikawa, MD.     Allergies:   Patient has no known allergies.   Social History   Socioeconomic History  . Marital status: Single    Spouse name: Not on file  . Number of children: Not on file  . Years of education: Not on file  . Highest education level: Not on file  Occupational History  . Not on file  Tobacco Use  . Smoking status: Never Smoker  . Smokeless tobacco: Never Used  Substance and Sexual Activity  . Alcohol use: Not Currently  . Drug use: Not on file  . Sexual activity: Not on file  Other Topics Concern  . Not on file  Social History Narrative  . Not on file   Social Determinants of Health   Financial Resource Strain: Not on file  Food Insecurity: Not on file  Transportation Needs: Not on file  Physical Activity: Not on file  Stress: Not on file  Social Connections: Not on file     Family History: The patient's family history includes Diabetes in his father.  ROS:   Please see the history of  present illness.    All other systems reviewed and are negative.  EKGs/Labs/Other Studies Reviewed:    The following studies were reviewed today:  EKG:  EKG is ordered today.  The ekg ordered today demonstrates normal sinus rhythm, rate 80, no ST/T abnormalities  Recent Labs: No results found for requested labs within last 8760 hours.  Recent Lipid Panel No results found for: CHOL, TRIG, HDL, CHOLHDL, VLDL, LDLCALC, LDLDIRECT  Physical Exam:    VS:  There were no vitals taken for this visit.    Wt Readings from Last 3 Encounters:  03/24/20 218 lb (98.9 kg)  11/05/19 212 lb 8 oz (96.4 kg)  10/23/19 213 lb (96.6 kg)     GEN: Well nourished, well developed  in no acute distress HEENT: Normal NECK: No JVD; No carotid bruits LYMPHATICS: No lymphadenopathy CARDIAC: RRR, no murmurs, rubs, gallops RESPIRATORY:  Clear to auscultation without rales, wheezing or rhonchi  ABDOMEN: Soft, non-tender, non-distended MUSCULOSKELETAL:  No edema; No deformity  SKIN: Warm and dry NEUROLOGIC:  Alert and oriented x 3 PSYCHIATRIC:  Normal affect   ASSESSMENT:    No diagnosis found. PLAN:     Palpitations: Zio patch on 06/16/2020 showed no significant arrhythmias.  Chest pain: Description consistent with noncardiac chest pain, as describes nonexertional pain that starts in his lower back and radiates across his chest.  Given age and lack of risk factors, he would be at low risk of coronary artery disease.  Recommend monitoring for now.    RTC in ***   Medication Adjustments/Labs and Tests Ordered: Current medicines are reviewed at length with the patient today.  Concerns regarding medicines are outlined above.  No orders of the defined types were placed in this encounter.  No orders of the defined types were placed in this encounter.   There are no Patient Instructions on file for this visit.   Signed, Little Ishikawa, MD  01/09/2021 9:19 PM     [facility]

## 2021-01-10 ENCOUNTER — Ambulatory Visit: Payer: Self-pay | Admitting: Cardiology

## 2021-01-16 NOTE — Progress Notes (Signed)
Cardiology Office Note:    Date:  01/19/2021   ID:  Harry Gonzalez, DOB 02/29/1991, MRN [id number]  PCP:  Patient, No Pcp Per  Cardiologist:  No primary care provider on file.  Electrophysiologist:  None   Referring MD: No ref. provider found   Chief Complaint  Patient presents with  . Chest Pain    History of Present Illness:    Harry Gonzalez is a 30 y.o. male with no significant past medical history who presents for follow-up. He was referred by Dr. Reola Calkins for evaluation of chest pain, initially seen on 10/23/2019. Reports that when he is sitting for an extended period of time he starts to develop pain that begins in his lower back.  States that pain will radiate up his back and also across his chest.  Occurs intermittently, will not have any episodes for months but then can happen multiple times per day.  Has been occurring for past year.  Describes that when pain radiates across his chest that feels pressure in his chest and back.  Starts at 2-3 out of 10 intensity but will escalate to 8-9 out of 10.  States that he walks for about an hour each day and prior to Covid was exercising in the gym.  Denies any exertional chest pain.  No smoking history.  No family history of heart disease in his immediate family.  Significant history in extended family, as reports his paternal grandfather died of a heart attack in his 61s and great uncles on his father's side also had heart attacks in 43s (though reports issues with drug abuse).  At initial clinic visit on 10/23/2019, felt to likely represent noncardiac chest pain. Recommended monitoring.  Since his initial visit, he reports that he has continued to have intermittent chest pain.  Occurs about once every 1 to 2 weeks.  He walks about 4 miles per day, denies any exertional pain.  Also reports that he has been having palpitations, that he describes as feeling like his heart is racing.  Has only occurred at night.  Happens about once every 2 weeks.   He denies any lightheadedness or syncope.  Zio patch on 06/16/2020 showed no significant arrhythmias.  Since last clinic visit, he reports that he has been doing okay.  Has been under a lot of stress recently.  Reports had an episode last month of burning in his leg that resolved after he took an aspirin.  Started having issues with chest and arm pain after that.  Describes chest pain in center to left side of his chest, dull aching pain that occurs about 3 times per week.  Radiates to left arm.  Can last minutes to hours.  Seems to occur when he is under stress.  He tries to walk for 30 minutes/day, denies any chest pain with this.  Palpitations have improved.  Denies any dyspnea, lightheadedness, syncope, lower extremity edema.  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   No past medical history on file.  No past surgical history on file.  Current Medications: No outpatient medications have been marked as taking for the 01/19/21 encounter (Office Visit) with Little Ishikawa, MD.     Allergies:   Patient has no known allergies.   Social History   Socioeconomic History  . Marital status: Single    Spouse name: Not on file  .  Number of children: Not on file  . Years of education: Not on file  . Highest education level: Not on file  Occupational History  . Not on file  Tobacco Use  . Smoking status: Never Smoker  . Smokeless tobacco: Never Used  Substance and Sexual Activity  . Alcohol use: Not Currently  . Drug use: Not on file  . Sexual activity: Not on file  Other Topics Concern  . Not on file  Social History Narrative  . Not on file   Social Determinants of Health   Financial Resource Strain: Not on file  Food Insecurity: Not on file  Transportation Needs: Not on file  Physical Activity: Not on file  Stress: Not on file  Social Connections: Not on  file     Family History: The patient's family history includes Diabetes in his father.  ROS:   Please see the history of present illness.    All other systems reviewed and are negative.  EKGs/Labs/Other Studies Reviewed:    The following studies were reviewed today:  EKG:  EKG is ordered today.  The ekg ordered today demonstrates normal sinus rhythm, rate 66, no ST/T abnormalities  Recent Labs: No results found for requested labs within last 8760 hours.  Recent Lipid Panel No results found for: CHOL, TRIG, HDL, CHOLHDL, VLDL, LDLCALC, LDLDIRECT  Physical Exam:    VS:  BP 110/70   Pulse 66   Ht 5\' 7"  (1.702 m)   Wt 207 lb 12.8 oz (94.3 kg)   SpO2 97%   BMI 32.55 kg/m     Wt Readings from Last 3 Encounters:  01/19/21 207 lb 12.8 oz (94.3 kg)  03/24/20 218 lb (98.9 kg)  11/05/19 212 lb 8 oz (96.4 kg)     GEN: Well nourished, well developed in no acute distress HEENT: Normal NECK: No JVD; No carotid bruits CARDIAC: RRR, no murmurs, rubs, gallops RESPIRATORY:  Clear to auscultation without rales, wheezing or rhonchi  ABDOMEN: Soft, non-tender, non-distended MUSCULOSKELETAL:  No edema; No deformity  SKIN: Warm and dry NEUROLOGIC:  Alert and oriented x 3 PSYCHIATRIC:  Normal affect   ASSESSMENT:    1. Chest pain of uncertain etiology   2. Palpitations    PLAN:     Palpitations: Zio patch on 06/16/2020 showed no significant arrhythmias.  Chest pain: Atypical in description, nonexertional.  Given age and lack of risk factors, he would be at low risk of coronary artery disease.  Recommend monitoring for now.    ? 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  Will check echocardiogram for further evaluation  RTC as needed   Medication Adjustments/Labs and Tests Ordered: Current medicines are reviewed at length with the patient today.  Concerns regarding  medicines are outlined above.  Orders Placed This Encounter  Procedures  . EKG 12-Lead  . ECHOCARDIOGRAM COMPLETE   No orders of the defined types were placed in this encounter.   Patient Instructions  Medication Instructions:  Your physician recommends that you continue on your current medications as directed. Please refer to the Current Medication list given to you today.  Testing/Procedures: Your physician has requested that you have an echocardiogram ASAP. Echocardiography is a painless test that uses sound waves to create images of your heart. It provides your doctor with information about the size and shape of your heart and how well your heart's  chambers and valves are working. This procedure takes approximately one hour. There are no restrictions for this procedure.   Follow-Up: At [facility], you and your health needs are our priority.  As part of our continuing mission to provide you with exceptional heart care, we have created designated Provider Care Teams.  These Care Teams include your primary Cardiologist (physician) and Advanced Practice Providers (APPs -  Physician Assistants and Nurse Practitioners) who all work together to provide you with the care you need, when you need it.  We recommend signing up for the patient portal called "MyChart".  Sign up information is provided on this After Visit Summary.  MyChart is used to connect with patients for Virtual Visits (Telemedicine).  Patients are able to view lab/test results, encounter notes, upcoming appointments, etc.  Non-urgent messages can be sent to your provider as well.   To learn more about what you can do with MyChart, go to [email or web address].    Your next appointment:   AS NEEDED with Dr. Bjorn Pippin unless echocardiogram is abnormal      Signed, Little Ishikawa, MD  01/19/2021 6:33 PM    [facility]

## 2021-01-19 ENCOUNTER — Encounter: Payer: Self-pay | Admitting: Cardiology

## 2021-01-19 ENCOUNTER — Other Ambulatory Visit: Payer: Self-pay

## 2021-01-19 ENCOUNTER — Ambulatory Visit (INDEPENDENT_AMBULATORY_CARE_PROVIDER_SITE_OTHER): Payer: 59 | Admitting: Cardiology

## 2021-01-19 VITALS — BP 110/70 | HR 66 | Ht 67.0 in | Wt 207.8 lb

## 2021-01-19 DIAGNOSIS — R002 Palpitations: Secondary | ICD-10-CM

## 2021-01-19 DIAGNOSIS — R079 Chest pain, unspecified: Secondary | ICD-10-CM

## 2021-01-19 NOTE — Patient Instructions (Signed)
Medication Instructions:  Your physician recommends that you continue on your current medications as directed. Please refer to the Current Medication list given to you today.  Testing/Procedures: Your physician has requested that you have an echocardiogram ASAP. Echocardiography is a painless test that uses sound waves to create images of your heart. It provides your doctor with information about the size and shape of your heart and how well your heart's chambers and valves are working. This procedure takes approximately one hour. There are no restrictions for this procedure.   Follow-Up: At [facility], you and your health needs are our priority.  As part of our continuing mission to provide you with exceptional heart care, we have created designated Provider Care Teams.  These Care Teams include your primary Cardiologist (physician) and Advanced Practice Providers (APPs -  Physician Assistants and Nurse Practitioners) who all work together to provide you with the care you need, when you need it.  We recommend signing up for the patient portal called "MyChart".  Sign up information is provided on this After Visit Summary.  MyChart is used to connect with patients for Virtual Visits (Telemedicine).  Patients are able to view lab/test results, encounter notes, upcoming appointments, etc.  Non-urgent messages can be sent to your provider as well.   To learn more about what you can do with MyChart, go to [email or web address].    Your next appointment:   AS NEEDED with Dr. Bjorn Pippin unless echocardiogram is abnormal

## 2021-02-14 ENCOUNTER — Other Ambulatory Visit: Payer: Self-pay

## 2021-02-14 ENCOUNTER — Ambulatory Visit (HOSPITAL_COMMUNITY): Payer: 59 | Attending: Internal Medicine

## 2021-02-14 DIAGNOSIS — R079 Chest pain, unspecified: Secondary | ICD-10-CM | POA: Diagnosis not present

## 2021-02-14 DIAGNOSIS — R002 Palpitations: Secondary | ICD-10-CM | POA: Insufficient documentation

## 2021-02-14 LAB — ECHOCARDIOGRAM COMPLETE
Area-P 1/2: 3.61 cm2
S' Lateral: 3 cm

## 2021-07-13 IMAGING — US US ABDOMEN LIMITED
1 series · 14 of 25 positions shown · non-contrast
Comparison: None.

CLINICAL DATA: Right upper quadrant pain

EXAM:
ULTRASOUND ABDOMEN LIMITED RIGHT UPPER QUADRANT

[Series 1: us abdomen limited · 0.25mm/px · 14 of 39 slices shown]
[im 1/39]
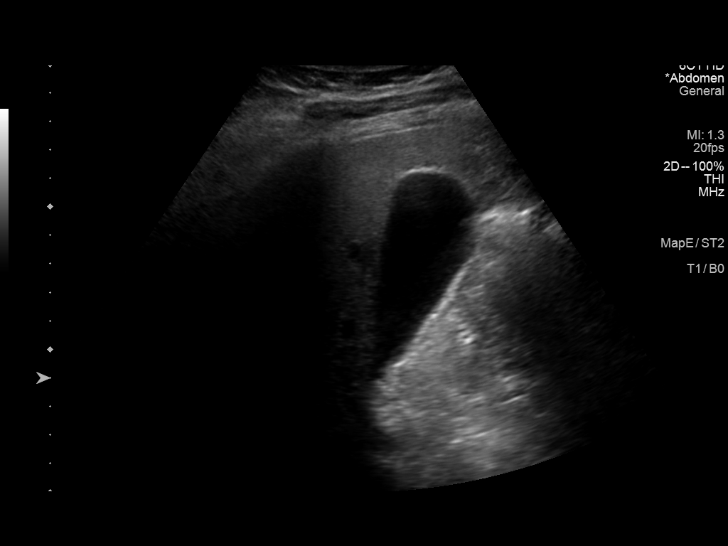
[im 4/39]
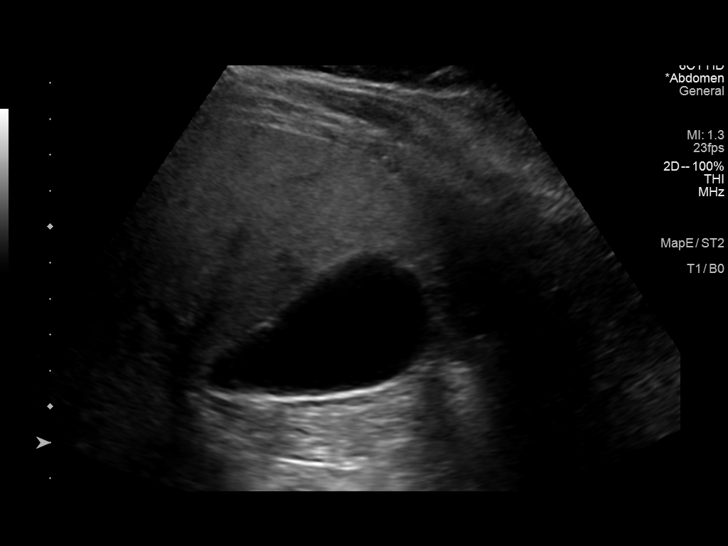
[im 7/39]
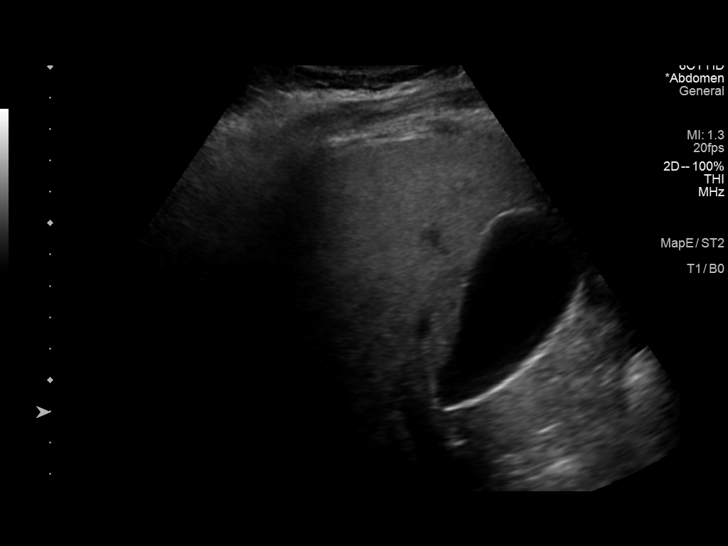
[im 10/39]
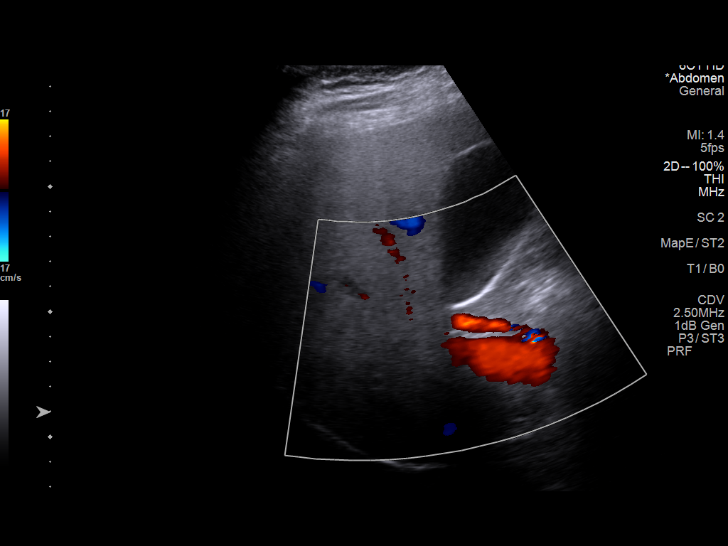
[im 13/39]
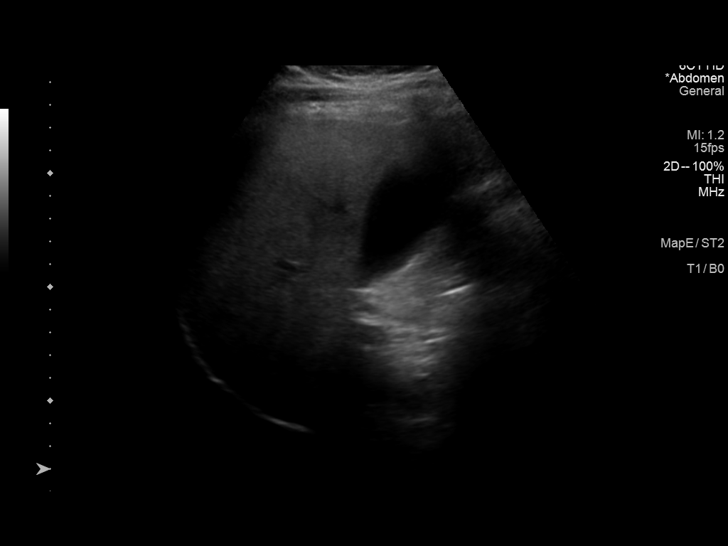
[im 15/39]
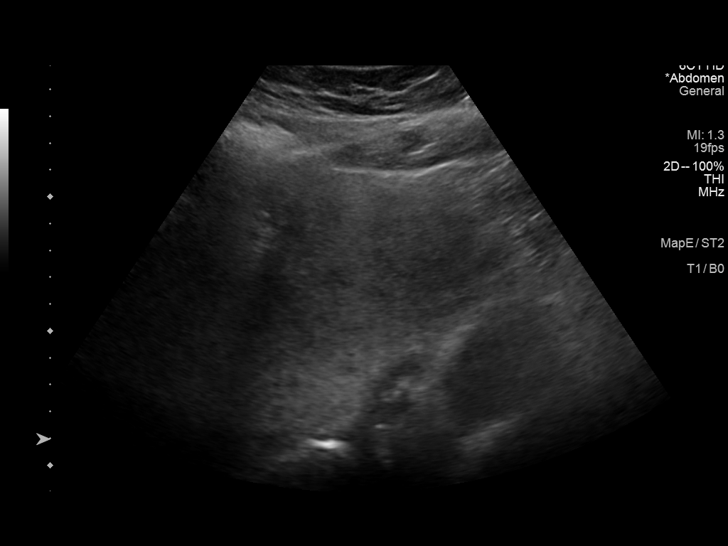
[im 18/39]
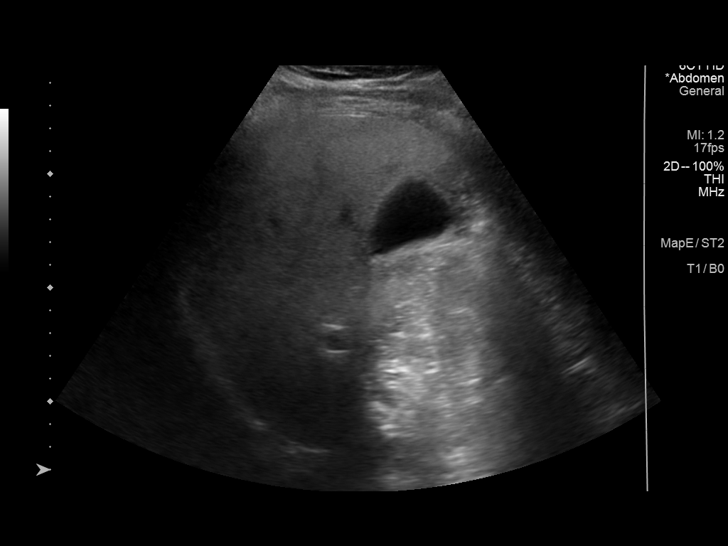
[im 21/39]
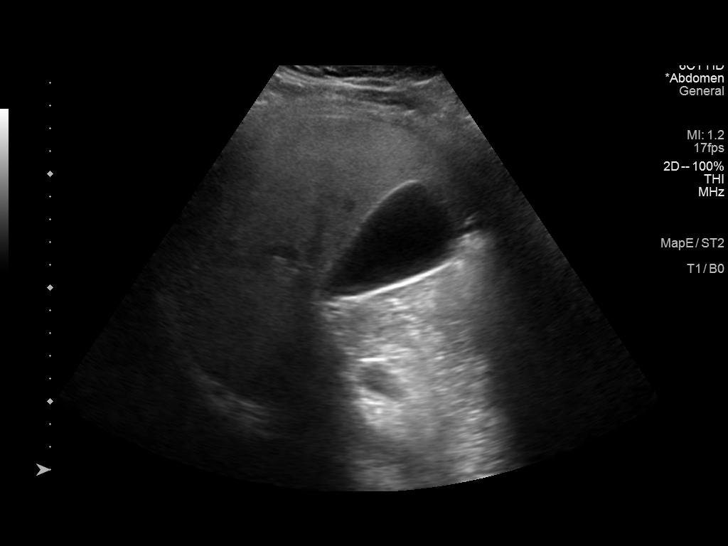
[im 24/39]
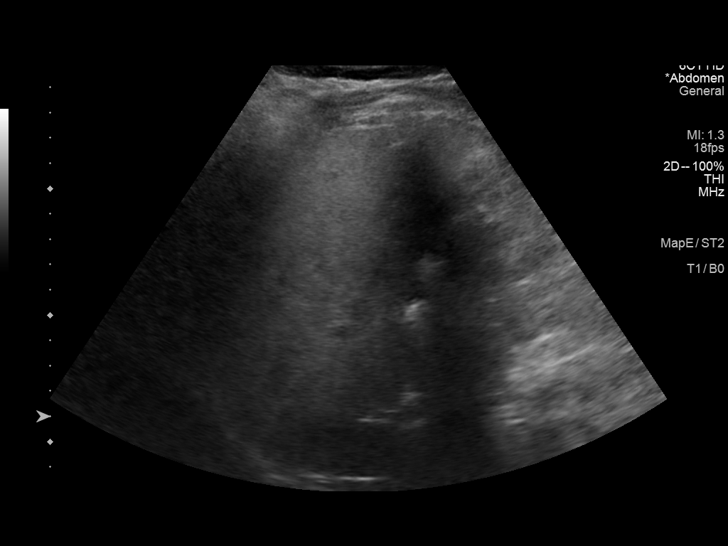
[im 26/39]
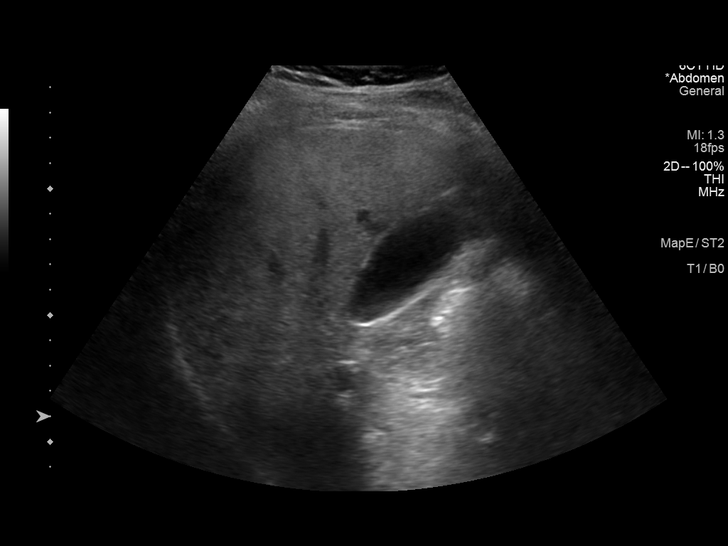
[im 29/39]
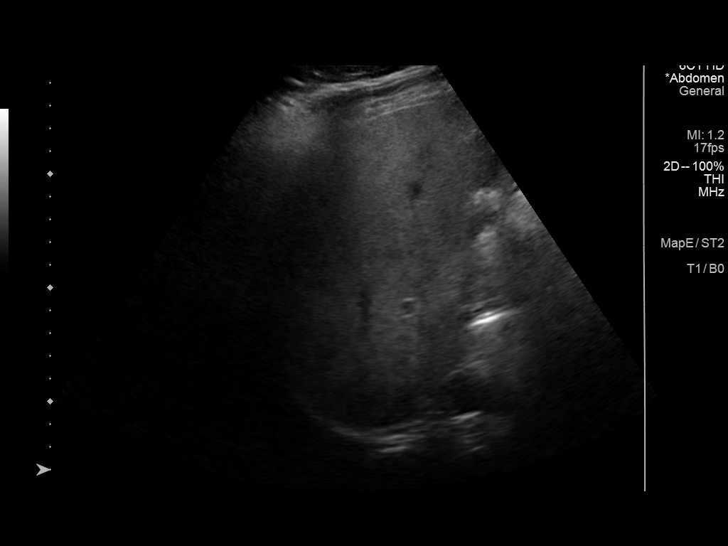
[im 32/39]
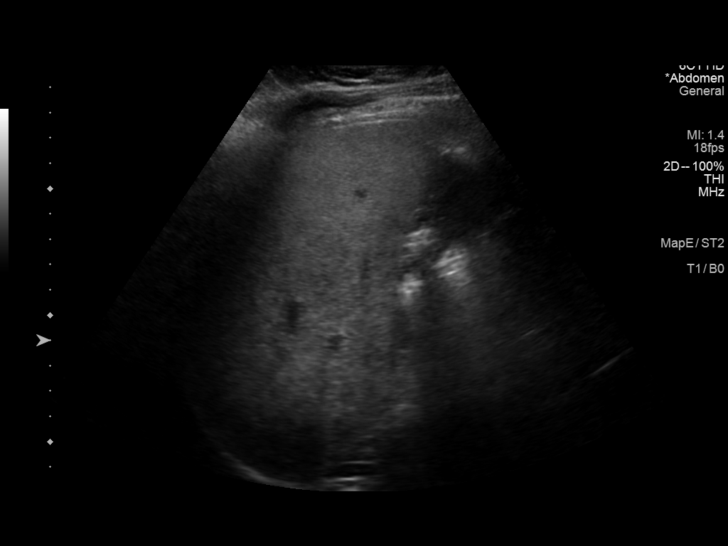
[im 35/39]
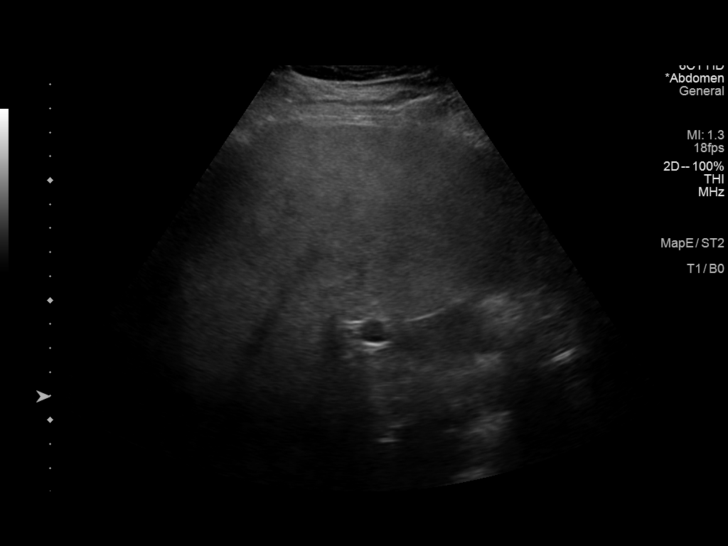
[im 39/39]
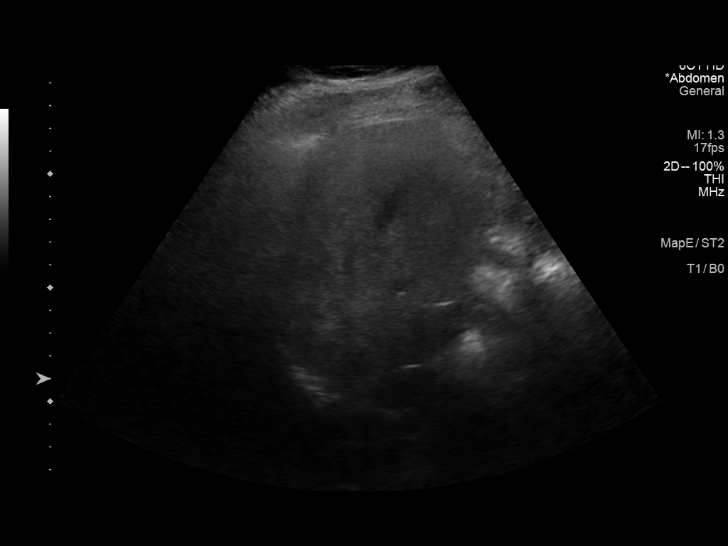

[14 of 25 positions shown; findings below may reference images not displayed]

FINDINGS: Gallbladder:

No gallstones or wall thickening visualized. No sonographic Murphy
sign noted by sonographer.

Common bile duct:

Diameter: 3.8 mm

Liver:

Increased hepatic echogenicity without focal hepatic abnormality.
Portal vein is patent on color Doppler imaging with normal direction
of blood flow towards the liver.

Other: None.
IMPRESSION: 1. Negative for gallstones or biliary dilatation.
2. Increased hepatic echogenicity consistent with steatosis.
